# Patient Record
Sex: Female | Born: 1985 | Race: Black or African American | Hispanic: No | Marital: Single | Smoking: Former smoker
Health system: Southern US, Community
[De-identification: ages and names within clinical notes are randomized; demographics above are authoritative.]

## PROBLEM LIST (undated history)

## (undated) HISTORY — PX: CHOLECYSTECTOMY: SHX55

---
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
## 2007-11-25 ENCOUNTER — Emergency Department (HOSPITAL_COMMUNITY): Admission: EM | Admit: 2007-11-25 | Discharge: 2007-11-25 | Payer: Self-pay | Admitting: Emergency Medicine

## 2008-02-15 ENCOUNTER — Encounter: Admission: RE | Admit: 2008-02-15 | Discharge: 2008-02-15 | Payer: Self-pay | Admitting: Obstetrics and Gynecology

## 2010-03-22 IMAGING — CT CT HEAD W/O CM
2 series · 16 of 30 positions shown, 20 images · non-contrast
Comparison: NONE

CLINICAL DATA: Headaches and nausea. 

CT HEAD WITHOUT INTRAVENOUS CONTRAST
TECHNIQUE: Axial 5 mm thick slices were obtained through the 
posterior fossa and 5 mm thick slices were obtained through the 
remaining portion of the head without intravenous contrast.

[Series 2: without contrast · axial · non-contrast · 0.43mm/px · z∈[+313,+443]mm · 13 of 32 slices shown, 17 images]
[im 3/32  brain]
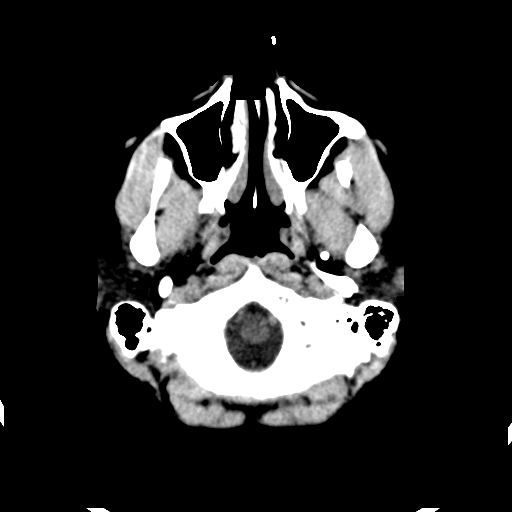
[im 3/32  bone]
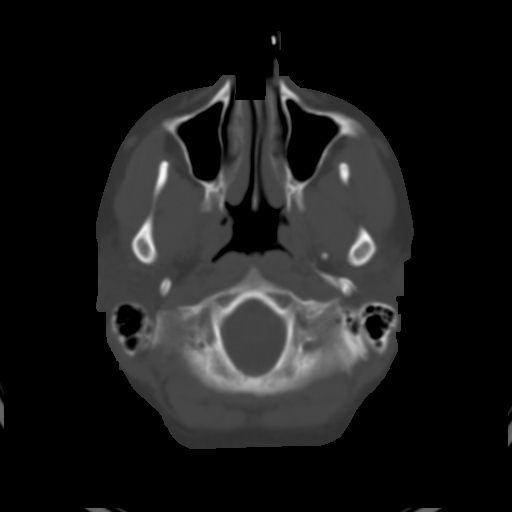
[im 5/32  brain]
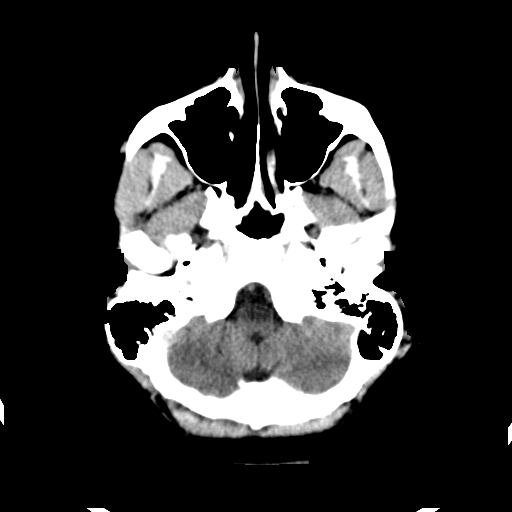
[im 7/32  brain]
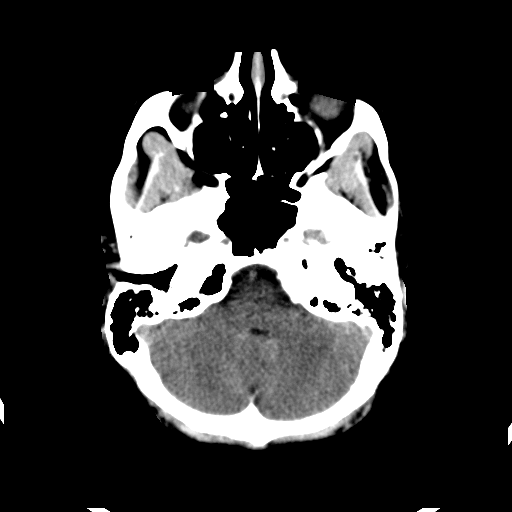
[im 9/32  brain]
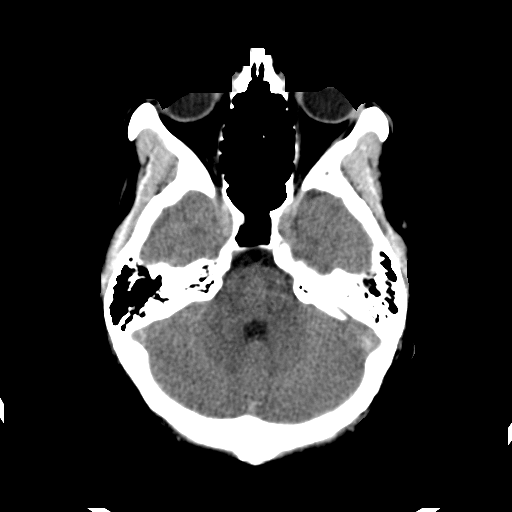
[im 12/32  brain]
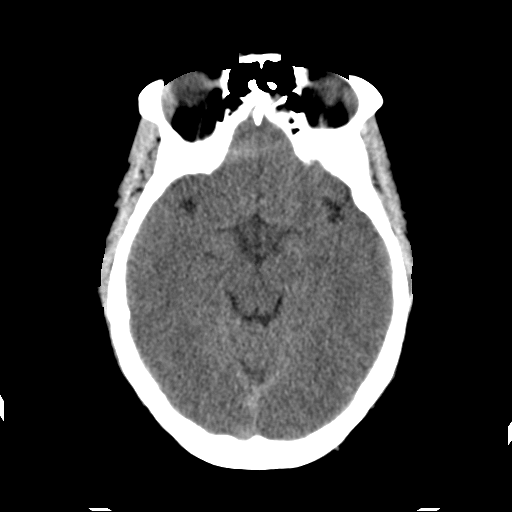
[im 12/32  bone]
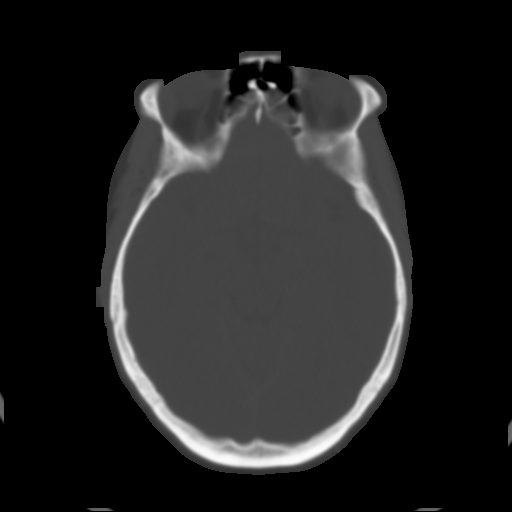
[im 14/32  brain]
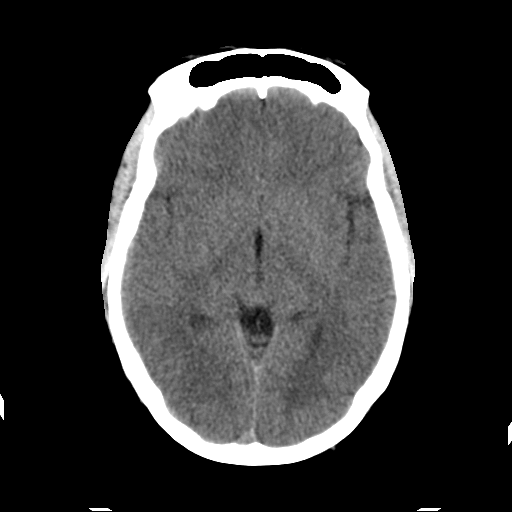
[im 16/32  brain]
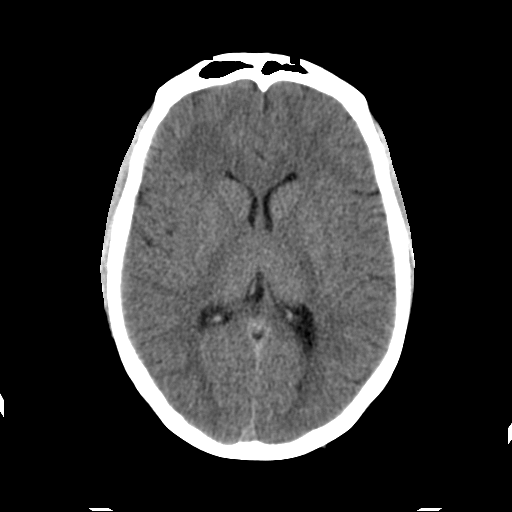
[im 18/32  brain]
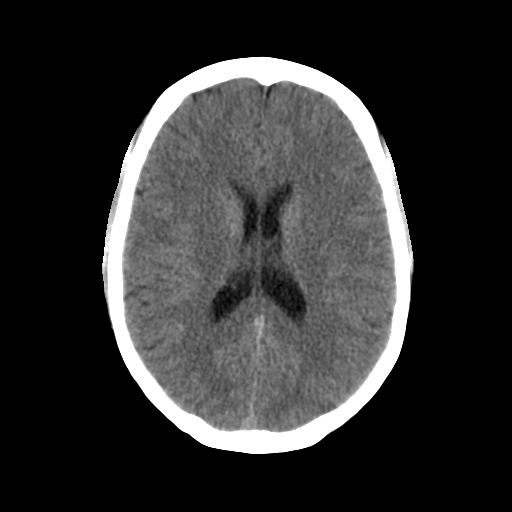
[im 20/32  brain]
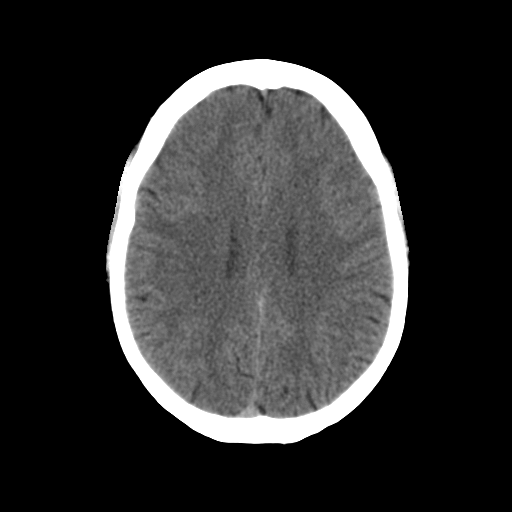
[im 20/32  bone]
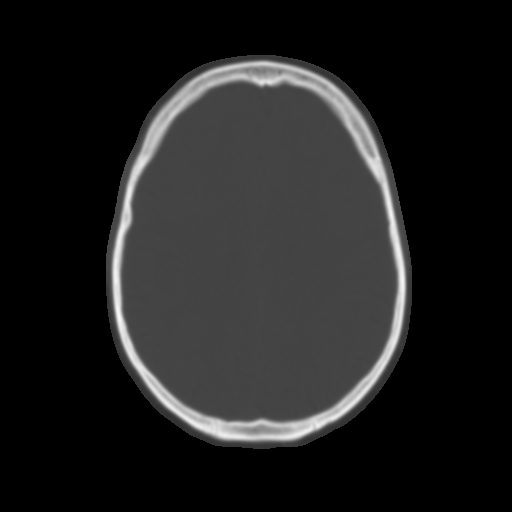
[im 23/32  brain]
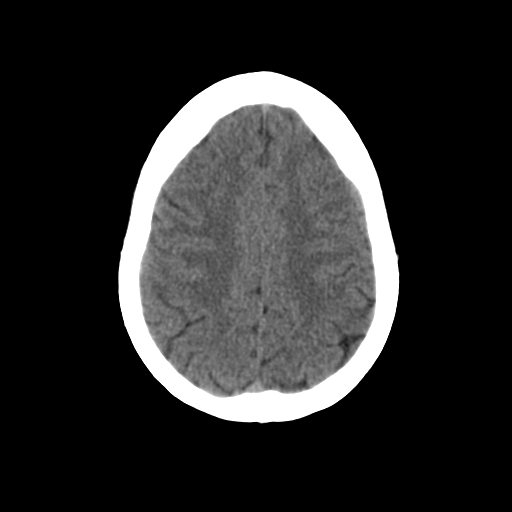
[im 25/32  brain]
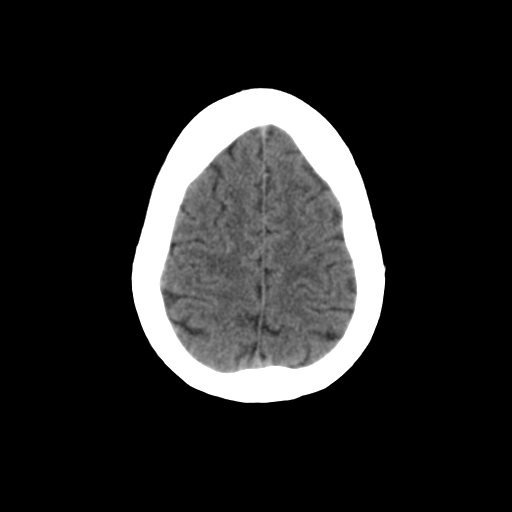
[im 27/32  brain]
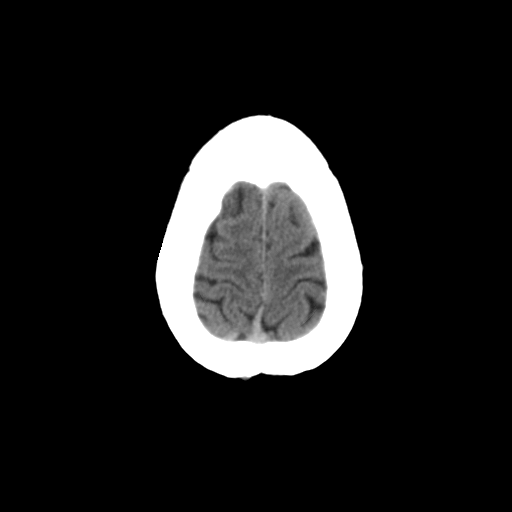
[im 29/32  brain]
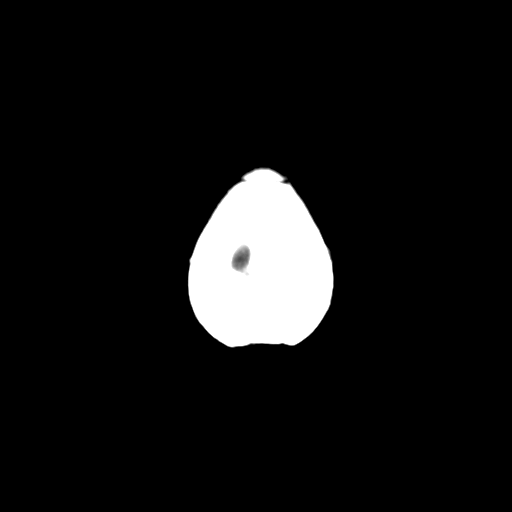
[im 29/32  bone]
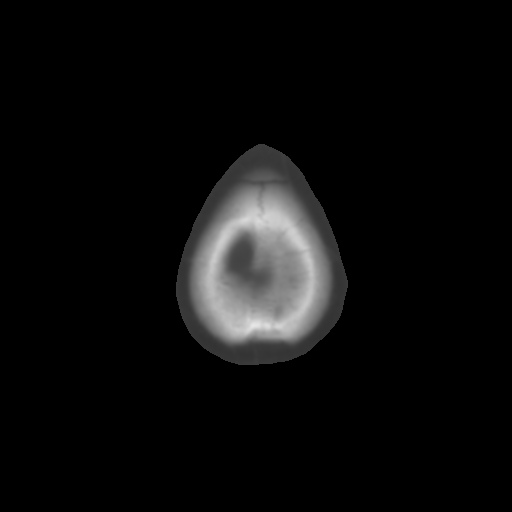

[Series 3: bone windows · axial · 0.43mm/px · z∈[+313,+358]mm · 3 of 32 slices shown]
[im 3/32  bone]
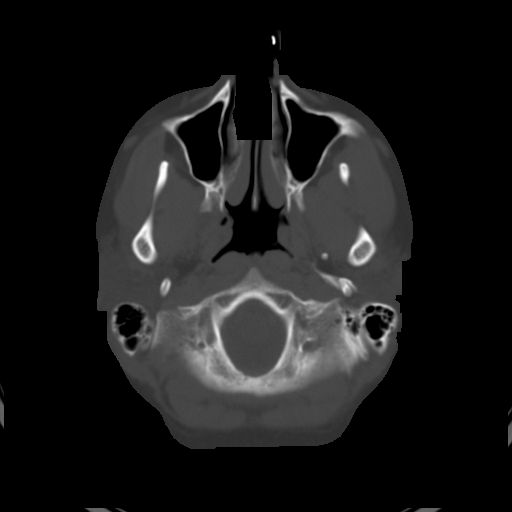
[im 7/32  bone]
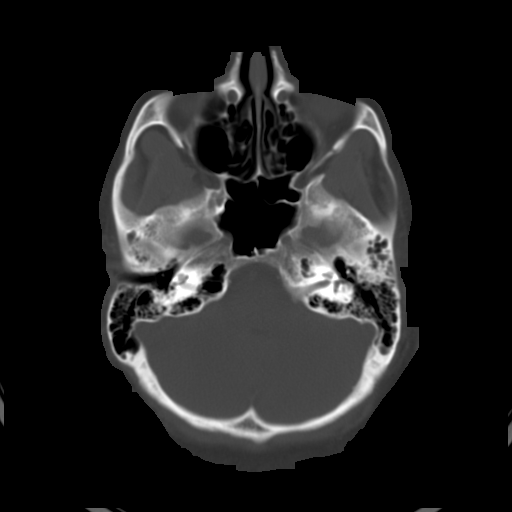
[im 12/32  bone]
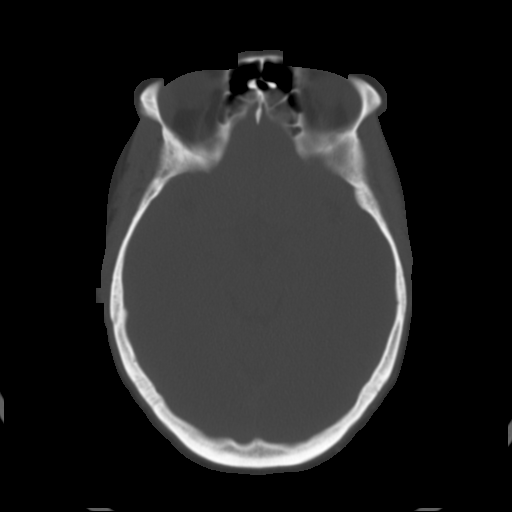

[16 of 30 positions shown; findings below may reference images not displayed]

FINDINGS: The visualized portions of the paranasal sinuses, 
orbits, and mastoids are unremarkable in appearance. The fourth, 
third and both lateral ventricles are identified.  There is no 
evidence of midline shift or mass effect infratentorially or 
supratentorially. No areas of abnormal radiolucency or 
radiodensity are identified. There is no evidence of subarachnoid 
or intracerebral hemorrhage.  There is no evidence of subdural or 
epidural hematoma. The calvarium is intact.
IMPRESSION: Normal unenhanced CT of the head. [place], [place] Date:  11/16/2007 HAYTAM MAGANA

## 2010-10-19 LAB — DIFFERENTIAL
Basophils Absolute: 0.1
Basophils Relative: 1
Eosinophils Relative: 1
Lymphocytes Relative: 34
Lymphs Abs: 3.6
Monocytes Absolute: 0.6
Monocytes Relative: 6
Neutro Abs: 6.3

## 2010-10-19 LAB — CBC
HCT: 46.4 — ABNORMAL HIGH
RDW: 13.5
WBC: 10.7 — ABNORMAL HIGH

## 2010-10-19 LAB — URINE MICROSCOPIC-ADD ON

## 2010-10-19 LAB — COMPREHENSIVE METABOLIC PANEL
AST: 22
Albumin: 3.9
Alkaline Phosphatase: 60
BUN: 6
CO2: 27
GFR calc non Af Amer: >60
Glucose, Bld: 84

## 2010-10-19 LAB — LIPASE, BLOOD: Lipase: 23

## 2010-10-19 LAB — URINALYSIS, ROUTINE W REFLEX MICROSCOPIC
Glucose, UA: NEGATIVE
Hgb urine dipstick: NEGATIVE
Ketones, ur: NEGATIVE
Urobilinogen, UA: 1

## 2010-10-19 LAB — POCT PREGNANCY, URINE: Preg Test, Ur: NEGATIVE

## 2010-11-25 ENCOUNTER — Encounter: Payer: Self-pay | Admitting: Emergency Medicine

## 2010-11-25 ENCOUNTER — Emergency Department (HOSPITAL_COMMUNITY): Payer: No Typology Code available for payment source

## 2010-11-25 ENCOUNTER — Emergency Department (HOSPITAL_COMMUNITY)
Admission: EM | Admit: 2010-11-25 | Discharge: 2010-11-25 | Disposition: A | Discharge status: Home / Self Care | Payer: No Typology Code available for payment source | Attending: Emergency Medicine | Admitting: Emergency Medicine

## 2010-11-25 DIAGNOSIS — S139XXA Sprain of joints and ligaments of unspecified parts of neck, initial encounter: Secondary | ICD-10-CM | POA: Insufficient documentation

## 2010-11-25 DIAGNOSIS — M542 Cervicalgia: Secondary | ICD-10-CM | POA: Insufficient documentation

## 2010-11-25 DIAGNOSIS — M549 Dorsalgia, unspecified: Secondary | ICD-10-CM | POA: Insufficient documentation

## 2010-11-25 DIAGNOSIS — R51 Headache: Secondary | ICD-10-CM | POA: Insufficient documentation

## 2010-11-25 DIAGNOSIS — S161XXA Strain of muscle, fascia and tendon at neck level, initial encounter: Secondary | ICD-10-CM

## 2010-11-25 MED ORDER — CYCLOBENZAPRINE HCL 5 MG PO TABS: 5.0000 mg | ORAL_TABLET | Freq: Three times a day (TID) | ORAL | Status: AC | PRN

## 2010-11-25 MED ORDER — ETODOLAC 300 MG PO CAPS: 300.0000 mg | ORAL_CAPSULE | Freq: Three times a day (TID) | ORAL | Status: DC
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
## 2011-01-27 ENCOUNTER — Encounter (HOSPITAL_COMMUNITY): Payer: Self-pay

## 2011-01-27 ENCOUNTER — Emergency Department (HOSPITAL_COMMUNITY)
Admission: EM | Admit: 2011-01-27 | Discharge: 2011-01-27 | Disposition: A | Discharge status: Home / Self Care | Payer: Self-pay | Attending: Emergency Medicine | Admitting: Emergency Medicine

## 2011-01-27 ENCOUNTER — Emergency Department (HOSPITAL_COMMUNITY): Payer: Self-pay

## 2011-01-27 DIAGNOSIS — R059 Cough, unspecified: Secondary | ICD-10-CM | POA: Insufficient documentation

## 2011-01-27 DIAGNOSIS — J069 Acute upper respiratory infection, unspecified: Secondary | ICD-10-CM | POA: Insufficient documentation

## 2011-01-27 DIAGNOSIS — R05 Cough: Secondary | ICD-10-CM | POA: Insufficient documentation

## 2011-01-27 DIAGNOSIS — R6883 Chills (without fever): Secondary | ICD-10-CM | POA: Insufficient documentation

## 2011-01-27 DIAGNOSIS — J3489 Other specified disorders of nose and nasal sinuses: Secondary | ICD-10-CM | POA: Insufficient documentation

## 2011-01-27 MED ORDER — IBUPROFEN 800 MG PO TABS: 800.0000 mg | ORAL_TABLET | Freq: Three times a day (TID) | ORAL | Status: AC

## 2011-01-27 MED ORDER — ALBUTEROL SULFATE HFA 108 (90 BASE) MCG/ACT IN AERS
2.0000 | INHALATION_SPRAY | Freq: Once | RESPIRATORY_TRACT | Status: DC
  Filled 2011-01-27: qty 6.7

## 2011-01-27 MED ORDER — HYDROCOD POLST-CHLORPHEN POLST 10-8 MG/5ML PO LQCR: 5.0000 mL | Freq: Every evening | ORAL | Status: DC | PRN

## 2011-01-27 NOTE — ED Notes (Signed)
Pt c/o yellow productive cough for several weeks.  Has been taking over the counter cough meds without relief.

## 2011-01-27 NOTE — ED Provider Notes (Signed)
History     CSN: [id number]  Arrival date & time 01/27/11  1620   First MD Initiated Contact with Patient 01/27/11 1933      Chief Complaint  Patient presents with  . Cough    (Consider location/radiation/quality/duration/timing/severity/associated sxs/prior treatment) HPI History provided by pt.   Pt c/o cough productive of yellow sputum for the past 3 weeks.  Cough painful.  Associated w/ chills, nasal congestion and sinus pressure.  Denies SOB, sore throat and N/V/D.  No known sick contacts.  Smokes cigarettes.  No PMH.    History reviewed. No pertinent past medical history.  Past Surgical History  Procedure Date  . Cholecystectomy     History reviewed. No pertinent family history.  History  Substance Use Topics  . Smoking status: Current Some Day Smoker  . Smokeless tobacco: Not on file  . Alcohol Use: Yes    OB History    Grav Para Term Preterm Abortions TAB SAB Ect Mult Living                  Review of Systems  All other systems reviewed and are negative.    Allergies  Review of patient's allergies indicates no known allergies.  Home Medications   Current Outpatient Rx  Name Route Sig Dispense Refill  . ACETAMINOPHEN 500 MG PO TABS Oral Take 1,000 mg by mouth every 6 (six) hours as needed. For pain      . HYDROCOD POLST-CPM POLST ER 10-8 MG/5ML PO LQCR Oral Take 5 mLs by mouth at bedtime as needed. 60 mL 0  . IBUPROFEN 800 MG PO TABS Oral Take 1 tablet (800 mg total) by mouth 3 (three) times daily. 12 tablet 0    BP 132/90  Pulse 79  Temp(Src) 97 F (36.1 C) (Oral)  Resp 18  SpO2 99%  LMP 12/27/2010  Physical Exam  Nursing note and vitals reviewed. Constitutional: She is oriented to person, place, and time. She appears well-developed and well-nourished. No distress.  HENT:  Head: No trismus in the jaw.  Right Ear: Tympanic membrane, external ear and ear canal normal.  Left Ear: Tympanic membrane, external ear and ear canal normal.    Mouth/Throat: Uvula is midline and mucous membranes are normal. No oropharyngeal exudate, posterior oropharyngeal edema or posterior oropharyngeal erythema.  Eyes:       Normal appearance  Neck: Normal range of motion. Neck supple.  Cardiovascular: Normal rate and regular rhythm.   Pulmonary/Chest: Effort normal and breath sounds normal.  Lymphadenopathy:    She has no cervical adenopathy.  Neurological: She is alert and oriented to person, place, and time.  Skin: Skin is warm and dry. No rash noted.  Psychiatric: She has a normal mood and affect. Her behavior is normal.    ED Course  Procedures (including critical care time)  Labs Reviewed - No data to display Dg Chest 2 View  01/27/2011  *RADIOLOGY REPORT*  Clinical Data: Cough.  CHEST - 2 VIEW  Comparison: None  Findings: The cardiac silhouette, mediastinal and hilar contours are within normal limits.  The lungs are clear.  No pleural effusion.  The bony thorax is intact.  IMPRESSION: No acute cardiopulmonary findings.  Original Report Authenticated By: P. Loralie Champagne, M.D.     1. Viral URI       MDM  Healthy 26yo F presents w/ cough x 3 weeks.  Afebrile, VS w/in nml limits, no respiratory distress and exam otherwise unremarkable. CXR neg for pneumonia.  Pt received an albuterol inhaler and d/c'd home w/ tussionex and ibuprofen.  Recommended sudafed, rest and fluids.  Return precautions discussed.         Otilio Miu, [place] 01/28/11 [phone]

## 2011-01-27 NOTE — ED Notes (Signed)
Pt states that for past 2 weeks she has been having a severe cough. She states that she is coughing up yellow colored sputum. She states that she has generalized malaise. She states that she has not been able to eat anything because it tends to make her cough even more. She has been trying otc cough meds with no relief.

## 2011-01-27 NOTE — ED Notes (Signed)
Discharge instructions reviewed with pt; verbalizes understanding.  Pt ambulatory to lobby.

## 2011-01-28 NOTE — ED Provider Notes (Signed)
Medical screening examination/treatment/procedure(s) were performed by non-physician practitioner and as supervising physician I was immediately available for consultation/collaboration.   Aala Ransom L Britnie Colville, MD 01/28/11 1449 

## 2012-10-26 ENCOUNTER — Encounter (HOSPITAL_COMMUNITY): Payer: Self-pay | Admitting: Emergency Medicine

## 2012-10-26 ENCOUNTER — Emergency Department (HOSPITAL_COMMUNITY)
Admission: EM | Admit: 2012-10-26 | Discharge: 2012-10-27 | Disposition: A | Discharge status: Home / Self Care | Payer: Self-pay | Attending: Emergency Medicine | Admitting: Emergency Medicine

## 2012-10-26 DIAGNOSIS — A419 Sepsis, unspecified organism: Secondary | ICD-10-CM | POA: Insufficient documentation

## 2012-10-26 DIAGNOSIS — R000 Tachycardia, unspecified: Secondary | ICD-10-CM | POA: Insufficient documentation

## 2012-10-26 DIAGNOSIS — R0682 Tachypnea, not elsewhere classified: Secondary | ICD-10-CM | POA: Insufficient documentation

## 2012-10-26 DIAGNOSIS — R112 Nausea with vomiting, unspecified: Secondary | ICD-10-CM | POA: Insufficient documentation

## 2012-10-26 DIAGNOSIS — Z87891 Personal history of nicotine dependence: Secondary | ICD-10-CM | POA: Insufficient documentation

## 2012-10-26 DIAGNOSIS — Z3202 Encounter for pregnancy test, result negative: Secondary | ICD-10-CM | POA: Insufficient documentation

## 2012-10-26 DIAGNOSIS — N12 Tubulo-interstitial nephritis, not specified as acute or chronic: Secondary | ICD-10-CM | POA: Insufficient documentation

## 2012-10-26 DIAGNOSIS — R5381 Other malaise: Secondary | ICD-10-CM | POA: Insufficient documentation

## 2012-10-26 LAB — COMPREHENSIVE METABOLIC PANEL
AST: 17 U/L (ref 0–37)
BUN: 15 mg/dL (ref 6–23)
Creatinine, Ser: 0.76 mg/dL (ref 0.50–1.10)
GFR calc Af Amer: >90 mL/min (ref >90)
Potassium: 3.5 mEq/L (ref 3.5–5.1)
Sodium: 140 mEq/L (ref 135–145)
Total Bilirubin: 1.3 mg/dL — ABNORMAL HIGH (ref 0.3–1.2)

## 2012-10-26 LAB — CBC WITH DIFFERENTIAL/PLATELET
Eosinophils Relative: 0 % (ref 0–5)
HCT: 40.6 % (ref 36.0–46.0)
Hemoglobin: 14.9 g/dL (ref 12.0–15.0)
MCH: 29.4 pg (ref 26.0–34.0)
Neutrophils Relative %: 81 % — ABNORMAL HIGH (ref 43–77)
Platelets: 233 10*3/uL (ref 150–400)
RBC: 5.06 MIL/uL (ref 3.87–5.11)
WBC: 16.5 10*3/uL — ABNORMAL HIGH (ref 4.0–10.5)

## 2012-10-26 LAB — CG4 I-STAT (LACTIC ACID): Lactic Acid, Venous: 0.93 mmol/L (ref 0.5–2.2)

## 2012-10-26 MED ORDER — SODIUM CHLORIDE 0.9 % IV BOLUS (SEPSIS)
2000.0000 mL | Freq: Once | INTRAVENOUS | Status: AC
  Administered 2012-10-27: 2000 mL via INTRAVENOUS

## 2012-10-26 MED ORDER — ONDANSETRON 4 MG PO TBDP
8.0000 mg | ORAL_TABLET | Freq: Once | ORAL | Status: AC
  Administered 2012-10-26: 8 mg via ORAL
  Filled 2012-10-26: qty 2

## 2012-10-26 MED ORDER — ACETAMINOPHEN 325 MG PO TABS
650.0000 mg | ORAL_TABLET | Freq: Once | ORAL | Status: AC
  Administered 2012-10-27: 650 mg via ORAL
  Filled 2012-10-26 (×2): qty 2

## 2012-10-26 NOTE — ED Notes (Signed)
Fever, chills, n/v, starting early today. Poor PO. Tmax 100.4. Unable to take any oral medications
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
## 2012-10-26 NOTE — ED Notes (Signed)
Pt cannot void at this time.  She is aware of need for sample.

## 2012-10-26 NOTE — ED Notes (Signed)
Nanavati, MD at bedside. 

## 2012-10-27 ENCOUNTER — Emergency Department (HOSPITAL_COMMUNITY): Payer: Self-pay

## 2012-10-27 ENCOUNTER — Encounter (HOSPITAL_COMMUNITY): Payer: Self-pay | Admitting: Radiology

## 2012-10-27 LAB — URINALYSIS, ROUTINE W REFLEX MICROSCOPIC
Bilirubin Urine: NEGATIVE
Glucose, UA: NEGATIVE mg/dL
Ketones, ur: 15 mg/dL — AB
Nitrite: NEGATIVE
Protein, ur: 30 mg/dL — AB
Specific Gravity, Urine: 1.025 (ref 1.005–1.030)
Urobilinogen, UA: 0.2 mg/dL (ref 0.0–1.0)
pH: 5.5 (ref 5.0–8.0)

## 2012-10-27 LAB — WET PREP, GENITAL

## 2012-10-27 LAB — POCT PREGNANCY, URINE: Preg Test, Ur: NEGATIVE

## 2012-10-27 LAB — URINE MICROSCOPIC-ADD ON

## 2012-10-27 MED ORDER — LIDOCAINE HCL (PF) 1 % IJ SOLN
INTRAMUSCULAR | Status: AC
  Filled 2012-10-27: qty 5

## 2012-10-27 MED ORDER — AZITHROMYCIN 250 MG PO TABS
1000.0000 mg | ORAL_TABLET | Freq: Once | ORAL | Status: AC
  Administered 2012-10-27: 1000 mg via ORAL
  Filled 2012-10-27: qty 4

## 2012-10-27 MED ORDER — CEFTRIAXONE SODIUM 250 MG IJ SOLR
250.0000 mg | Freq: Once | INTRAMUSCULAR | Status: AC
  Administered 2012-10-27: 250 mg via INTRAMUSCULAR
  Filled 2012-10-27: qty 250

## 2012-10-27 MED ORDER — ONDANSETRON HCL 4 MG/2ML IJ SOLN
4.0000 mg | Freq: Once | INTRAMUSCULAR | Status: DC
  Filled 2012-10-27: qty 2

## 2012-10-27 MED ORDER — DEXTROSE 5 % IV SOLN
1.0000 g | INTRAVENOUS | Status: DC
  Administered 2012-10-27: 1 g via INTRAVENOUS
  Filled 2012-10-27: qty 10

## 2012-10-27 MED ORDER — METOCLOPRAMIDE HCL 5 MG/ML IJ SOLN
10.0000 mg | Freq: Once | INTRAMUSCULAR | Status: AC
  Administered 2012-10-27: 10 mg via INTRAVENOUS
  Filled 2012-10-27: qty 2

## 2012-10-27 MED ORDER — DEXTROSE 5 % IV SOLN: 250.0000 mg | Freq: Once | INTRAVENOUS | Status: DC

## 2012-10-27 MED ORDER — IOHEXOL 300 MG/ML  SOLN
25.0000 mL | Freq: Once | INTRAMUSCULAR | Status: AC | PRN
  Administered 2012-10-27: 25 mL via ORAL

## 2012-10-27 MED ORDER — IOHEXOL 300 MG/ML  SOLN
100.0000 mL | Freq: Once | INTRAMUSCULAR | Status: AC | PRN
  Administered 2012-10-27: 100 mL via INTRAVENOUS

## 2012-10-27 MED ORDER — PROMETHAZINE HCL 25 MG PO TABS: 25.0000 mg | ORAL_TABLET | Freq: Four times a day (QID) | ORAL | Status: AC | PRN

## 2012-10-27 MED ORDER — FLUCONAZOLE 100 MG PO TABS
150.0000 mg | ORAL_TABLET | Freq: Once | ORAL | Status: AC
  Administered 2012-10-27: 150 mg via ORAL
  Filled 2012-10-27: qty 2

## 2012-10-27 MED ORDER — CEPHALEXIN 500 MG PO CAPS: 500.0000 mg | ORAL_CAPSULE | Freq: Four times a day (QID) | ORAL | Status: AC

## 2012-10-27 MED ORDER — PROMETHAZINE HCL 25 MG RE SUPP: 25.0000 mg | Freq: Four times a day (QID) | RECTAL | Status: AC | PRN

## 2012-10-27 MED ORDER — HYDROCODONE-ACETAMINOPHEN 5-325 MG PO TABS: 1.0000 | ORAL_TABLET | Freq: Four times a day (QID) | ORAL | Status: AC | PRN

## 2012-10-27 NOTE — ED Notes (Signed)
Pt finished drinking her oral contrast. CT is aware.

## 2012-10-29 LAB — URINE CULTURE: Colony Count: >=100000

## 2012-12-12 ENCOUNTER — Ambulatory Visit: Payer: Self-pay

## 2013-06-12 ENCOUNTER — Emergency Department (HOSPITAL_COMMUNITY)
Admission: EM | Admit: 2013-06-12 | Discharge: 2013-06-12 | Disposition: A | Discharge status: Home / Self Care | Payer: BC Managed Care – PPO | Source: Home / Self Care

## 2013-06-12 ENCOUNTER — Encounter (HOSPITAL_COMMUNITY): Payer: Self-pay | Admitting: Emergency Medicine

## 2013-06-12 ENCOUNTER — Other Ambulatory Visit (HOSPITAL_COMMUNITY)
Admission: RE | Admit: 2013-06-12 | Discharge: 2013-06-12 | Disposition: A | Discharge status: Home / Self Care | Payer: BC Managed Care – PPO | Source: Ambulatory Visit | Attending: Family Medicine | Admitting: Family Medicine

## 2013-06-12 DIAGNOSIS — N760 Acute vaginitis: Secondary | ICD-10-CM | POA: Insufficient documentation

## 2013-06-12 DIAGNOSIS — Z113 Encounter for screening for infections with a predominantly sexual mode of transmission: Secondary | ICD-10-CM | POA: Insufficient documentation

## 2013-06-12 DIAGNOSIS — Z202 Contact with and (suspected) exposure to infections with a predominantly sexual mode of transmission: Secondary | ICD-10-CM

## 2013-06-12 LAB — POCT URINALYSIS DIP (DEVICE)
Bilirubin Urine: NEGATIVE
Glucose, UA: NEGATIVE mg/dL
Hgb urine dipstick: NEGATIVE
Ketones, ur: NEGATIVE mg/dL
LEUKOCYTES UA: NEGATIVE
Nitrite: NEGATIVE
PROTEIN: NEGATIVE mg/dL
Specific Gravity, Urine: 1.02 (ref 1.005–1.030)
UROBILINOGEN UA: 0.2 mg/dL (ref 0.0–1.0)
pH: 7.5 (ref 5.0–8.0)

## 2013-06-12 LAB — POCT PREGNANCY, URINE: Preg Test, Ur: NEGATIVE
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
## 2013-06-12 NOTE — ED Notes (Signed)
Concern for poss STD. Had unprotected sex early last month, denies any symptoms. LMP 04-19-2013

## 2013-06-13 NOTE — ED Provider Notes (Signed)
Medical screening examination/treatment/procedure(s) were performed by a resident physician or non-physician practitioner and as the supervising physician I was immediately available for consultation/collaboration.  Lorin Hauck, MD    Tzvi Economou S Meric Joye, MD 06/13/13 0748 

## 2020-10-12 ENCOUNTER — Emergency Department (HOSPITAL_COMMUNITY): Payer: Self-pay

## 2020-10-12 ENCOUNTER — Emergency Department (HOSPITAL_COMMUNITY)
Admission: EM | Admit: 2020-10-12 | Discharge: 2020-10-12 | Disposition: A | Discharge status: Home / Self Care | Payer: Self-pay | Attending: Emergency Medicine | Admitting: Emergency Medicine

## 2020-10-12 ENCOUNTER — Encounter (HOSPITAL_COMMUNITY): Payer: Self-pay

## 2020-10-12 ENCOUNTER — Other Ambulatory Visit: Payer: Self-pay

## 2020-10-12 DIAGNOSIS — L03211 Cellulitis of face: Secondary | ICD-10-CM

## 2020-10-12 DIAGNOSIS — Z87891 Personal history of nicotine dependence: Secondary | ICD-10-CM | POA: Insufficient documentation

## 2020-10-12 DIAGNOSIS — R221 Localized swelling, mass and lump, neck: Secondary | ICD-10-CM | POA: Insufficient documentation

## 2020-10-12 DIAGNOSIS — L0211 Cutaneous abscess of neck: Secondary | ICD-10-CM | POA: Insufficient documentation

## 2020-10-12 LAB — BASIC METABOLIC PANEL
Anion gap: 6 (ref 5–15)
BUN: 7 mg/dL (ref 6–20)
CO2: 28 mmol/L (ref 22–32)
Calcium: 9 mg/dL (ref 8.9–10.3)
Chloride: 106 mmol/L (ref 98–111)
Creatinine, Ser: 0.79 mg/dL (ref 0.44–1.00)
GFR, Estimated: >60 mL/min (ref >60)
Glucose, Bld: 120 mg/dL — ABNORMAL HIGH (ref 70–99)
Potassium: 3.2 mmol/L — ABNORMAL LOW (ref 3.5–5.1)
Sodium: 140 mmol/L (ref 135–145)

## 2020-10-12 LAB — CBC WITH DIFFERENTIAL/PLATELET
Abs Immature Granulocytes: 0.12 10*3/uL — ABNORMAL HIGH (ref 0.00–0.07)
Basophils Absolute: 0 10*3/uL (ref 0.0–0.1)
Basophils Relative: 0 %
Eosinophils Absolute: 0 10*3/uL (ref 0.0–0.5)
Eosinophils Relative: 0 %
HCT: 43.6 % (ref 36.0–46.0)
Hemoglobin: 15.8 g/dL — ABNORMAL HIGH (ref 12.0–15.0)
Immature Granulocytes: 1 %
Lymphocytes Relative: 6 %
Lymphs Abs: 1.3 10*3/uL (ref 0.7–4.0)
MCH: 30.5 pg (ref 26.0–34.0)
MCHC: 36.2 g/dL — ABNORMAL HIGH (ref 30.0–36.0)
MCV: 84.2 fL (ref 80.0–100.0)
Monocytes Absolute: 1.1 10*3/uL — ABNORMAL HIGH (ref 0.1–1.0)
Monocytes Relative: 5 %
Neutro Abs: 18.7 10*3/uL — ABNORMAL HIGH (ref 1.7–7.7)
Neutrophils Relative %: 88 %
Platelets: 220 10*3/uL (ref 150–400)
RBC: 5.18 MIL/uL — ABNORMAL HIGH (ref 3.87–5.11)
RDW: 13.8 % (ref 11.5–15.5)
WBC: 21.3 10*3/uL — ABNORMAL HIGH (ref 4.0–10.5)
nRBC: 0 % (ref 0.0–0.2)

## 2020-10-12 LAB — I-STAT BETA HCG BLOOD, ED (MC, WL, AP ONLY): I-stat hCG, quantitative: <5 m[IU]/mL (ref <5)

## 2020-10-12 MED ORDER — IOHEXOL 350 MG/ML SOLN
75.0000 mL | Freq: Once | INTRAVENOUS | Status: AC | PRN
  Administered 2020-10-12: 75 mL via INTRAVENOUS

## 2020-10-12 MED ORDER — CLINDAMYCIN HCL 300 MG PO CAPS
300.0000 mg | ORAL_CAPSULE | Freq: Once | ORAL | Status: AC
  Administered 2020-10-12: 300 mg via ORAL
  Filled 2020-10-12: qty 1

## 2020-10-12 MED ORDER — CLINDAMYCIN HCL 300 MG PO CAPS: 300.0000 mg | ORAL_CAPSULE | Freq: Four times a day (QID) | ORAL | 0 refills | Status: AC

## 2020-10-12 MED ORDER — CLINDAMYCIN PHOSPHATE 600 MG/50ML IV SOLN
600.0000 mg | Freq: Once | INTRAVENOUS | Status: AC
  Administered 2020-10-12: 600 mg via INTRAVENOUS
  Filled 2020-10-12: qty 50

## 2020-10-12 NOTE — ED Triage Notes (Signed)
Pt complains of abscess to left jaw and neck since Saturday.

## 2020-10-12 NOTE — Discharge Instructions (Addendum)
Begin taking clindamycin as prescribed.  Follow-up with your dentist later this week.  Return to the emergency department in the meantime if you develop worsening swelling, inability to open or close your mouth, severe sore throat, difficulty breathing or swallowing, or for other new and concerning symptoms.

## 2020-10-12 NOTE — ED Provider Notes (Signed)
[facility]-EMERGENCY DEPT Provider Note   CSN: [id number] Arrival date & time: 10/12/20  0349     History Chief Complaint  Patient presents with   Abscess    Kristi Charles is a 35 y.o. female.  Patient is a 34 year old female with no significant past medical history.  She presents with a 2-day history of swelling to her left jaw and neck.  This began in the absence of any injury or trauma.  She denies any difficulty breathing or swallowing.  She denies any dental pain or intraoral swelling.  The history is provided by the patient.  Abscess Location:  Head/neck Abscess quality: induration, painful and redness   Duration:  2 days Progression:  Worsening Pain details:    Severity:  Moderate   Timing:  Constant   Progression:  Worsening Chronicity:  New     No past medical history on file.  There are no problems to display for this patient.   Past Surgical History:  Procedure Laterality Date   CHOLECYSTECTOMY       OB History   No obstetric history on file.     No family history on file.  Social History   Tobacco Use   Smoking status: Former  Substance Use Topics   Alcohol use: Yes   Drug use: No    Home Medications Prior to Admission medications   Medication Sig Start Date End Date Taking? Authorizing Provider  cephALEXin (KEFLEX) 500 MG capsule Take 1 capsule (500 mg total) by mouth 4 (four) times daily. 10/27/12   Derwood Kaplan, MD  HYDROcodone-acetaminophen (NORCO/VICODIN) 5-325 MG per tablet Take 1 tablet by mouth every 6 (six) hours as needed for pain. 10/27/12   Derwood Kaplan, MD  promethazine (PHENERGAN) 25 MG suppository Place 1 suppository (25 mg total) rectally every 6 (six) hours as needed for nausea. 10/27/12   Derwood Kaplan, MD  promethazine (PHENERGAN) 25 MG tablet Take 1 tablet (25 mg total) by mouth every 6 (six) hours as needed for nausea. 10/27/12   Derwood Kaplan, MD    Allergies    Patient has no known  allergies.  Review of Systems   Review of Systems  All other systems reviewed and are negative.  Physical Exam Updated Vital Signs BP 134/90 (BP Location: Right Arm)   Pulse 86   Temp 98.4 F (36.9 C) (Oral)   Resp 16   Ht 5\' 5"  (1.651 m)   Wt 72.6 kg   SpO2 95%   BMI 26.63 kg/m   Physical Exam Vitals and nursing note reviewed.  Constitutional:      General: She is not in acute distress.    Appearance: Normal appearance. She is not ill-appearing.  HENT:     Head: Normocephalic and atraumatic.     Comments: There is tenderness and swelling to the left jaw/neck adjacent to the angle of the mandible.  There is no stridor and she denies difficulty breathing or swallowing. Pulmonary:     Effort: Pulmonary effort is normal.  Skin:    General: Skin is warm and dry.  Neurological:     Mental Status: She is alert.    ED Results / Procedures / Treatments   Labs (all labs ordered are listed, but only abnormal results are displayed) Labs Reviewed - No data to display  EKG None  Radiology No results found.  Procedures Procedures   Medications Ordered in ED Medications  clindamycin (CLEOCIN) IVPB 600 mg (has no administration in time  range)    ED Course  I have reviewed the triage vital signs and the nursing notes.  Pertinent labs & imaging results that were available during my care of the patient were reviewed by me and considered in my medical decision making (see chart for details).    MDM Rules/Calculators/A&P  Patient presenting here with complaints of left jaw and facial swelling.  This began 2 days ago and is worsening.  Patient is afebrile and nontoxic in appearance, but has a white count of 21,000.  I have obtained a CT scan to evaluate the extent of this infection.  There is no obvious fluid collection and it appears as though the origin is dental in nature.  She has received IV clindamycin and seems appropriate for discharge with oral clindamycin.  Patient  is to return as needed if he worsens.  Final Clinical Impression(s) / ED Diagnoses Final diagnoses:  None    Rx / DC Orders ED Discharge Orders     None        Geoffery Lyons, MD 10/12/20 [phone]

## 2020-11-11 ENCOUNTER — Encounter (HOSPITAL_COMMUNITY): Payer: Self-pay | Admitting: Radiology

## 2022-03-04 IMAGING — CT CT NECK W/ CM
3 of 4 series · 12 of 33 positions shown, 14 images · IV contrast (omnipaque)
Comparison: None.

CLINICAL DATA: Soft tissue mass, neck, no prior imaging

EXAM:
CT NECK WITH CONTRAST
TECHNIQUE: Multidetector CT imaging of the neck was performed using the
standard protocol following the bolus administration of intravenous
contrast.
CONTRAST:  75mL OMNIPAQUE IOHEXOL 350 MG/ML SOLN

[Series 5: axial · axial · 0.35mm/px · z∈[-257,-77]mm · 4 of 136 slices shown, 5 images]
[im 23/136  soft-tissue]
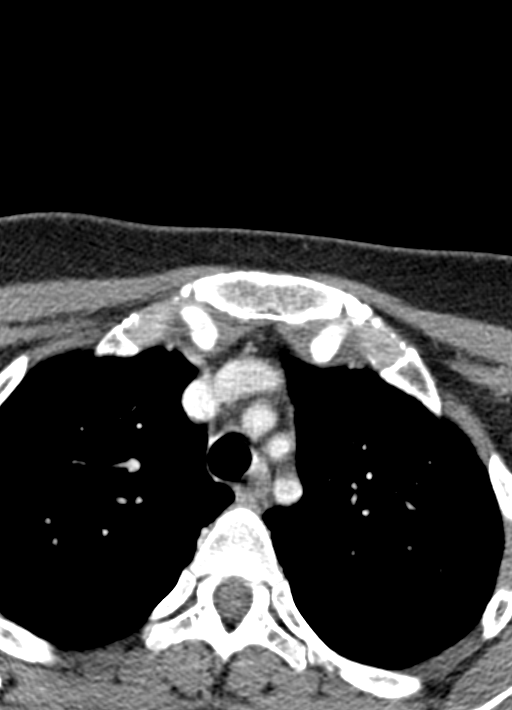
[im 23/136  bone]
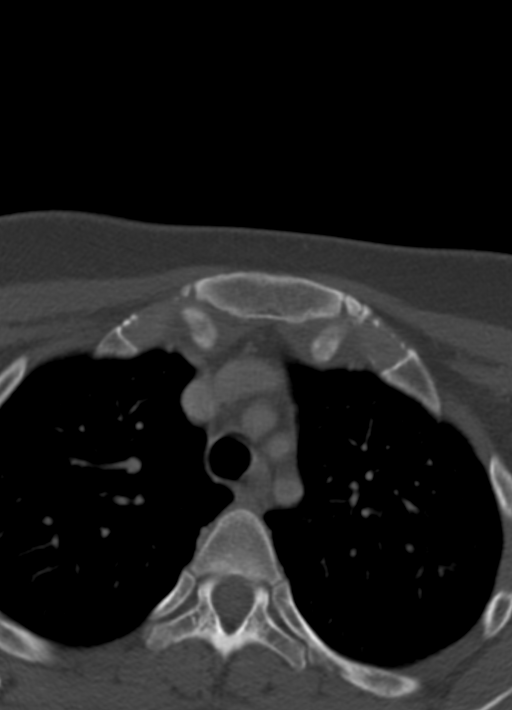
[im 46/136  bone]
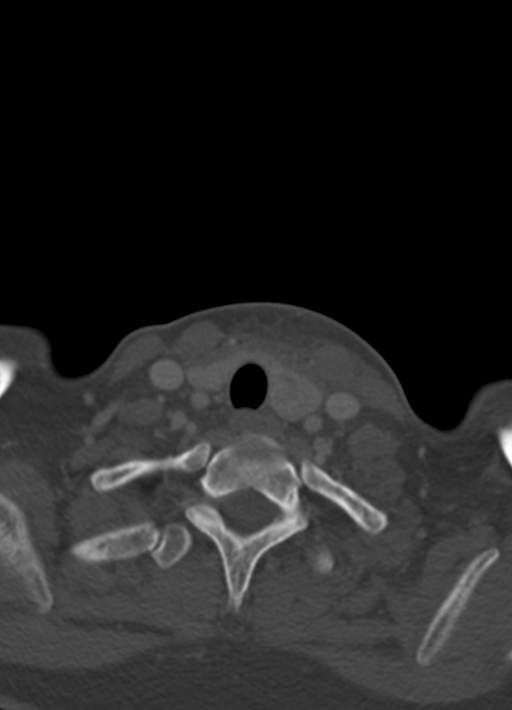
[im 91/136  bone]
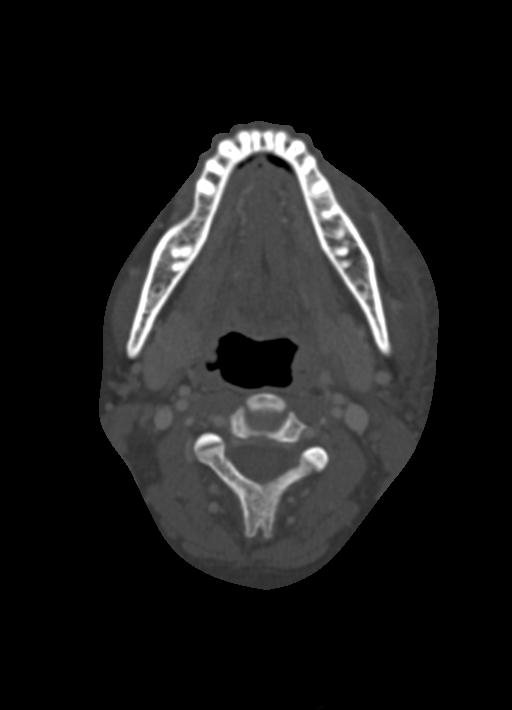
[im 113/136  bone]
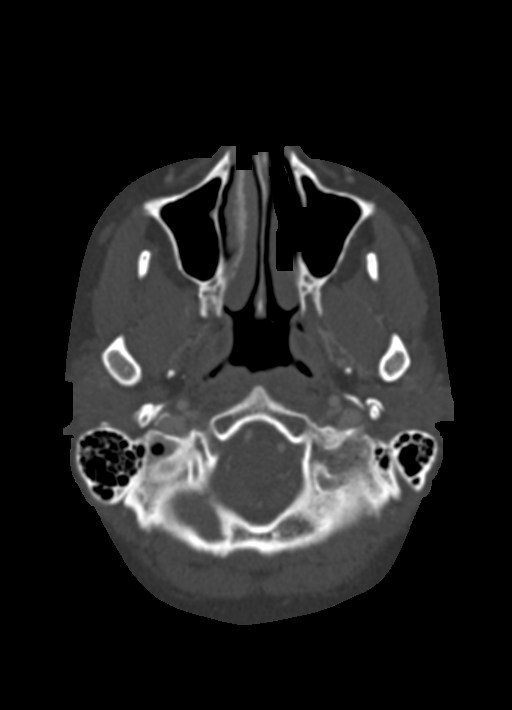

[Series 6: coronal · coronal · 0.35mm/px · 3 of 122 slices shown]
[im 25/122  bone]
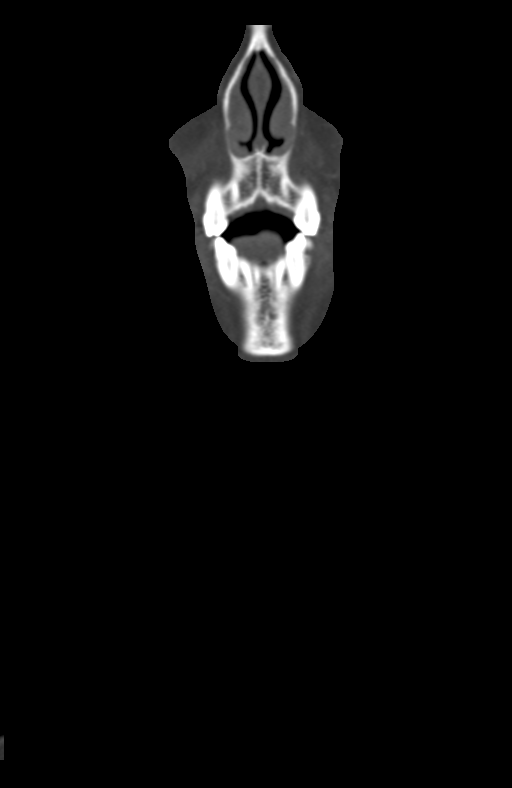
[im 49/122  bone]
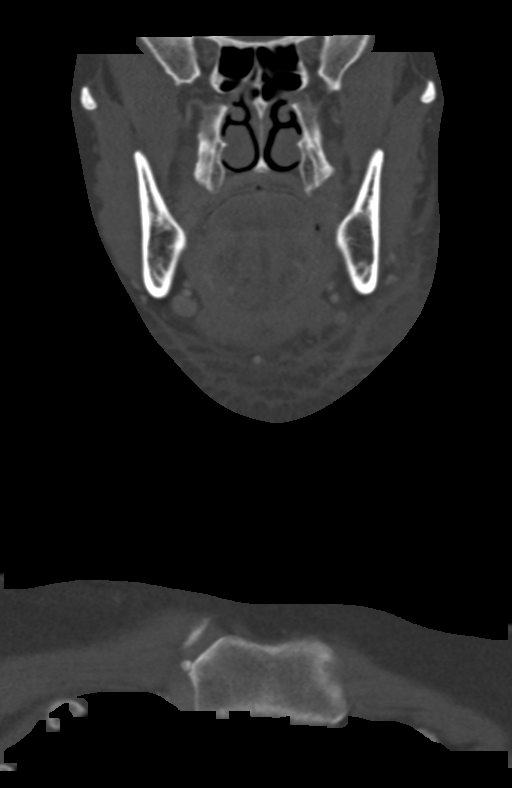
[im 73/122  bone]
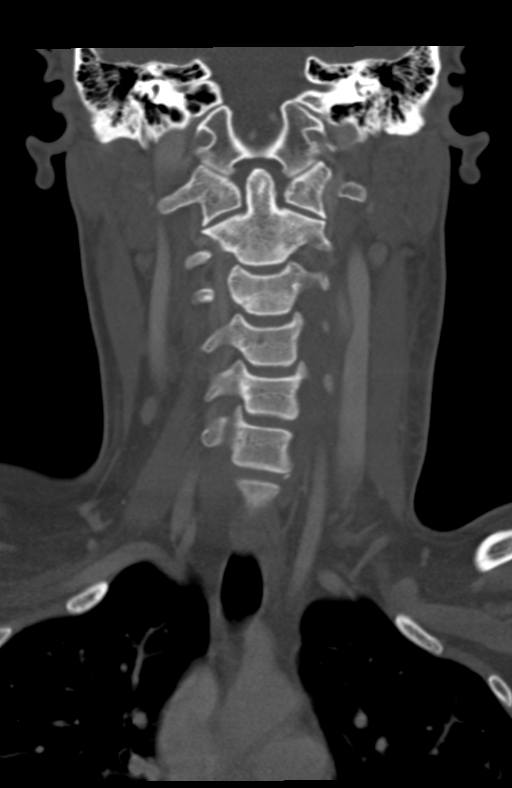

[Series 7: sagittal · sagittal · 0.48mm/px · 5 of 90 slices shown, 6 images]
[im 30/90  bone]
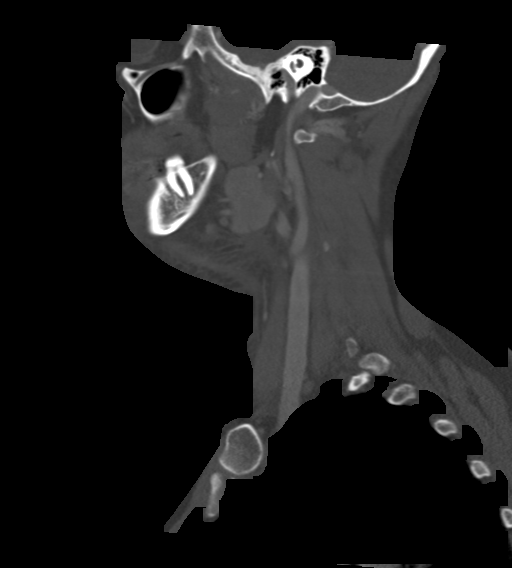
[im 38/90  bone]
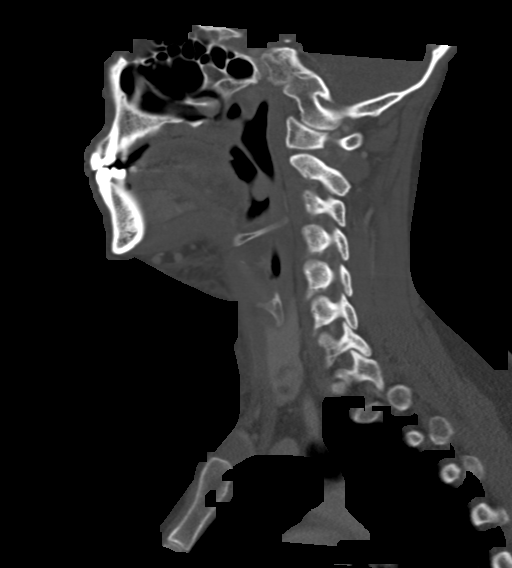
[im 45/90  soft-tissue]
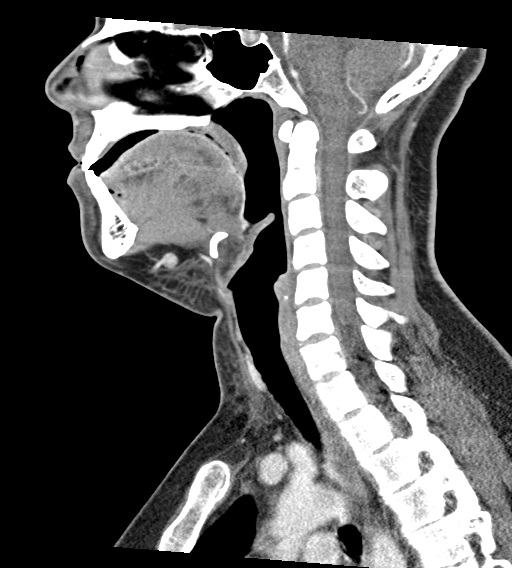
[im 45/90  bone]
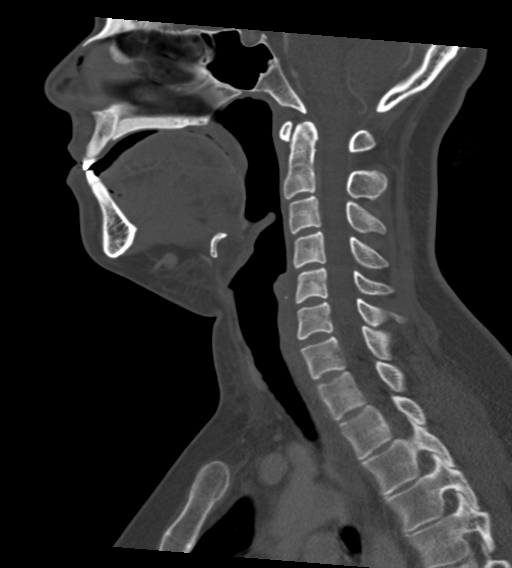
[im 52/90  bone]
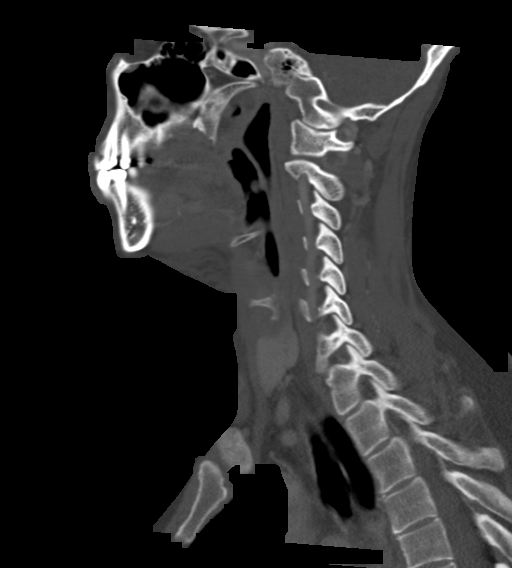
[im 60/90  bone]
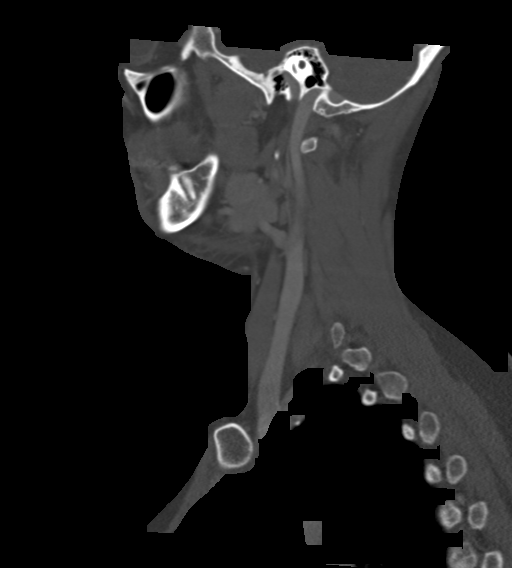

[12 of 33 positions shown; findings below may reference images not displayed]

FINDINGS: Pharynx and larynx: Normal. No mass or swelling.

Salivary glands: No inflammation, mass, or stone.

Thyroid: Heterogeneous left thyroid nodule, measuring up to 1.6 cm.

Lymph nodes: Prominent, but nonenlarged bilateral submandibular and
submental lymph nodes, nonspecific but most likely reactive given
below findings.

Vascular: Negative.

Limited intracranial: Negative.

Visualized orbits: Negative.

Mastoids and visualized paranasal sinuses: Clear.

Skeleton: No acute or aggressive process.

Upper chest: Visualized lung apices are clear.

Dental: Periapical lucency of the left second premolar with
overlying lingual cortical dehiscence (series 3, image 44).

Other: Soft tissue thickening and edema overlying the left mandible,
extending inferiorly into the upper neck along the platysma. No
discrete drainable fluid collection.
IMPRESSION: 1. Edema overlying the left mandible extending into the left upper
neck, likely cellulitis.
2. Periapical lucency of the adjacent left second premolar with
overlying lingual cortical dehiscence, likely the source of
infection.
3. No discrete, drainable fluid collection.
4. Heterogeneous 1.6 cm left thyroid nodule. Recommend thyroid
ultrasound (ref: [HOSPITAL]. [DATE]): 143-50).
